# Patient Record
Sex: Male | Born: 1990 | Race: Black or African American | Hispanic: No | Marital: Single | Smoking: Former smoker
Health system: Southern US, Community
[De-identification: ages and names within clinical notes are randomized; demographics above are authoritative.]

---

## 2020-06-18 ENCOUNTER — Ambulatory Visit
Admission: EM | Admit: 2020-06-18 | Discharge: 2020-06-18 | Disposition: A | Discharge status: Home / Self Care | Payer: Self-pay | Attending: Family Medicine | Admitting: Family Medicine

## 2020-06-18 ENCOUNTER — Other Ambulatory Visit: Payer: Self-pay

## 2020-06-18 ENCOUNTER — Ambulatory Visit (INDEPENDENT_AMBULATORY_CARE_PROVIDER_SITE_OTHER): Payer: Self-pay

## 2020-06-18 DIAGNOSIS — M79671 Pain in right foot: Secondary | ICD-10-CM

## 2020-06-18 DIAGNOSIS — M722 Plantar fascial fibromatosis: Secondary | ICD-10-CM

## 2020-06-18 MED ORDER — PREDNISONE 10 MG (21) PO TBPK: ORAL_TABLET | Freq: Every day | ORAL | 0 refills | Status: AC

## 2020-06-18 MED ORDER — CYCLOBENZAPRINE HCL 10 MG PO TABS: 10.0000 mg | ORAL_TABLET | Freq: Two times a day (BID) | ORAL | 0 refills | Status: DC | PRN

## 2020-06-18 NOTE — ED Triage Notes (Signed)
See provider note. 

## 2020-06-18 NOTE — ED Provider Notes (Signed)
[facility]   [id number] 06/18/20 Arrival Time: 1412  EP:PIRJJ PAIN  SUBJECTIVE: History from: patient. Daylyn Azbill is a 30 y.o. male complains of right foot pain intermittently for the last week. Reports that he is on his feet all day in steel toe boots. Has not taken OTC medications for this. Denies a precipitating event or specific injury. Localizes the pain to the sole of the right foot. Describes the pain as intermittent and achy in character with intermittent sharp pain. Symptoms are made worse with activity. Denies similar symptoms in the past. Denies fever, chills, erythema, ecchymosis, effusion, weakness, numbness and tingling, saddle paresthesias, loss of bowel or bladder function.      ROS: As per HPI.  All other pertinent ROS negative.     No past medical history on file.  Not on File No current facility-administered medications on file prior to encounter.   No current outpatient medications on file prior to encounter.   Social History   Socioeconomic History  . Marital status: Single    Spouse name: Not on file  . Number of children: Not on file  . Years of education: Not on file  . Highest education level: Not on file  Occupational History  . Not on file  Tobacco Use  . Smoking status: Not on file  . Smokeless tobacco: Not on file  Substance and Sexual Activity  . Alcohol use: Not on file  . Drug use: Not on file  . Sexual activity: Not on file  Other Topics Concern  . Not on file  Social History Narrative  . Not on file   Social Determinants of Health   Financial Resource Strain: Not on file  Food Insecurity: Not on file  Transportation Needs: Not on file  Physical Activity: Not on file  Stress: Not on file  Social Connections: Not on file  Intimate Partner Violence: Not on file   No family history on file.  OBJECTIVE:  Vitals:   06/18/20 1446  BP: 105/68  Pulse: 63  Resp: 20  Temp: 98.4 F (36.9 C)  TempSrc: Oral  SpO2: 98%     General appearance: ALERT; in no acute distress.  Head: NCAT Lungs: Normal respiratory effort CV: pulses 2+ bilaterally. Cap refill < 2 seconds Musculoskeletal:  Inspection: Skin warm, dry, clear and intact No erythema, effusion noted Palpation: R plantar fascia tender to palpation ROM: Limited ROM active and passive to right foot Skin: warm and dry Neurologic: Ambulates without difficulty; Sensation intact about the upper/ lower extremities Psychological: alert and cooperative; normal mood and affect  DIAGNOSTIC STUDIES:  DG Foot Complete Right  Result Date: 06/18/2020 CLINICAL DATA:  Right foot pain. EXAM: RIGHT FOOT COMPLETE - 3+ VIEW COMPARISON:  None. FINDINGS: There is no evidence of fracture or dislocation. There is no evidence of arthropathy or other focal bone abnormality. Soft tissues are unremarkable. IMPRESSION: Negative. Electronically Signed   By: Obie Dredge M.D.   On: 06/18/2020 15:42     ASSESSMENT & PLAN:  1. Plantar fasciitis of right foot      Meds ordered this encounter  Medications  . predniSONE (STERAPRED UNI-PAK 21 TAB) 10 MG (21) TBPK tablet    Sig: Take by mouth daily for 6 days. Take 6 tablets on day 1, 5 tablets on day 2, 4 tablets on day 3, 3 tablets on day 4, 2 tablets on day 5, 1 tablet on day 6    Dispense:  21 tablet  Refill:  0    Order Specific Question:   Supervising Provider    Answer:   Merrilee Jansky [id number]  . cyclobenzaprine (FLEXERIL) 10 MG tablet    Sig: Take 1 tablet (10 mg total) by mouth 2 (two) times daily as needed for muscle spasms.    Dispense:  20 tablet    Refill:  0    Order Specific Question:   Supervising Provider    Answer:   Merrilee Jansky [[id number]]    Xray of R foot negative today Prescribed steroid taper Prescribed flexeril prn Continue conservative management of rest, ice, and gentle stretches Take ibuprofen as needed for pain relief (may cause abdominal discomfort, ulcers, and GI bleeds avoid  taking with other NSAIDs) Take cyclobenzaprine at nighttime for symptomatic relief. Avoid driving or operating heavy machinery while using medication. Follow up with PCP if symptoms persist Return or go to the ER if you have any new or worsening symptoms (fever, chills, chest pain, abdominal pain, changes in bowel or bladder habits, pain radiating into lower legs)   Reviewed expectations re: course of current medical issues. Questions answered. Outlined signs and symptoms indicating need for more acute intervention. Patient verbalized understanding. After Visit Summary given.       Moshe Cipro, NP 06/18/20 1554

## 2020-06-18 NOTE — Discharge Instructions (Signed)
Your xray today was negative for any fractures or misalignments.   I have sent in a prednisone taper for you to take for 6 days. 6 tablets on day one, 5 tablets on day two, 4 tablets on day three, 3 tablets on day four, 2 tablets on day five, and 1 tablet on day six.  I have sent in flexeril for you to take twice a day as needed for muscle spasms. This medication can make you sleepy. Do not drive or operate heavy machinery with this medication.  May take ibuprofen and tylenol  as needed for pain.  Follow up with this office or with primary care if symptoms are persisting.  Follow up in the ER for high fever, trouble swallowing, trouble breathing, other concerning symptoms.

## 2020-10-04 ENCOUNTER — Ambulatory Visit
Admission: EM | Admit: 2020-10-04 | Discharge: 2020-10-04 | Disposition: A | Discharge status: Home / Self Care | Payer: Self-pay | Attending: Emergency Medicine | Admitting: Emergency Medicine

## 2020-10-04 ENCOUNTER — Other Ambulatory Visit: Payer: Self-pay

## 2020-10-04 ENCOUNTER — Encounter: Payer: Self-pay | Admitting: Emergency Medicine

## 2020-10-04 DIAGNOSIS — L02412 Cutaneous abscess of left axilla: Secondary | ICD-10-CM

## 2020-10-04 MED ORDER — IBUPROFEN 800 MG PO TABS: 800.0000 mg | ORAL_TABLET | Freq: Three times a day (TID) | ORAL | 0 refills | Status: DC

## 2020-10-04 MED ORDER — DOXYCYCLINE HYCLATE 100 MG PO CAPS: 100.0000 mg | ORAL_CAPSULE | Freq: Two times a day (BID) | ORAL | 0 refills | Status: AC

## 2020-10-04 NOTE — ED Provider Notes (Signed)
[name]    CSN: [id number] Arrival date & time: 10/04/20  0806      History   Chief Complaint Chief Complaint  Patient presents with   Ingrown hair    HPI Dave Jones is a 30 y.o. male presenting today for evaluation of ingrown hair.  Reports symptoms began 6 days ago.  Denies history of similar.  Denies fevers.  Pain with moving left arm.  HPI  History reviewed. No pertinent past medical history.  There are no problems to display for this patient.   History reviewed. No pertinent surgical history.     Home Medications    Prior to Admission medications   Medication Sig Start Date End Date Taking? Authorizing Provider  doxycycline (VIBRAMYCIN) 100 MG capsule Take 1 capsule (100 mg total) by mouth 2 (two) times daily for 7 days. 10/04/20 10/11/20 Yes Aaleigha Bozza C, PA-C  ibuprofen (ADVIL) 800 MG tablet Take 1 tablet (800 mg total) by mouth 3 (three) times daily. 10/04/20  Yes Blandon Offerdahl C, PA-C  cyclobenzaprine (FLEXERIL) 10 MG tablet Take 1 tablet (10 mg total) by mouth 2 (two) times daily as needed for muscle spasms. 06/18/20   Moshe Cipro, NP    Family History History reviewed. No pertinent family history.  Social History Social History   Tobacco Use   Smoking status: Former    Years: 15.00    Pack years: 0.00    Types: Cigarettes    Passive exposure: Never   Smokeless tobacco: Never  Substance Use Topics   Alcohol use: Yes    Comment: ocassionally   Drug use: Not Currently    Types: Marijuana     Allergies   Patient has no known allergies.   Review of Systems Review of Systems  Constitutional:  Negative for fatigue and fever.  Eyes:  Negative for redness, itching and visual disturbance.  Respiratory:  Negative for shortness of breath.   Cardiovascular:  Negative for chest pain and leg swelling.  Gastrointestinal:  Negative for nausea and vomiting.  Musculoskeletal:  Negative for arthralgias and myalgias.  Skin:   Positive for color change and wound. Negative for rash.  Neurological:  Negative for dizziness, syncope, weakness, light-headedness and headaches.    Physical Exam Triage Vital Signs ED Triage Vitals  Enc Vitals Group     BP --      Pulse --      Resp --      Temp --      Temp src --      SpO2 --      Weight 10/04/20 0828 155 lb (70.3 kg)     Height 10/04/20 0828 5\' 11"  (1.803 m)     Head Circumference --      Peak Flow --      Pain Score 10/04/20 0826 9     Pain Loc --      Pain Edu? --      Excl. in GC? --    No data found.  Updated Vital Signs BP 130/74 (BP Location: Right Arm)   Pulse 71   Temp 98.5 F (36.9 C) (Oral)   Resp 13   Ht 5\' 11"  (1.803 m)   Wt 155 lb (70.3 kg)   SpO2 98%   BMI 21.62 kg/m   Visual Acuity Right Eye Distance:   Left Eye Distance:   Bilateral Distance:    Right Eye Near:   Left Eye Near:    Bilateral Near:  Physical Exam Vitals and nursing note reviewed.  Constitutional:      Appearance: He is well-developed.     Comments: No acute distress  HENT:     Head: Normocephalic and atraumatic.     Nose: Nose normal.  Eyes:     Conjunctiva/sclera: Conjunctivae normal.  Cardiovascular:     Rate and Rhythm: Normal rate.  Pulmonary:     Effort: Pulmonary effort is normal. No respiratory distress.  Abdominal:     General: There is no distension.  Musculoskeletal:        General: Normal range of motion.     Cervical back: Neck supple.  Skin:    General: Skin is warm and dry.     Comments: Left axilla with approximately 4 cm abscess with fluctuance, central scabbing, tender to palpation  Neurological:     Mental Status: He is alert and oriented to person, place, and time.     UC Treatments / Results  Labs (all labs ordered are listed, but only abnormal results are displayed) Labs Reviewed - No data to display  EKG   Radiology No results found.  Procedures Incision and Drainage  Date/Time: 10/04/2020 11:20  AM Performed by: Irvan Tiedt, [place] C, PA-C Authorized by: Jadzia Ibsen, Junius Creamer, PA-C   Consent:    Consent obtained:  Verbal   Consent given by:  Patient   Risks, benefits, and alternatives were discussed: yes     Risks discussed:  Bleeding, pain and incomplete drainage   Alternatives discussed:  No treatment Universal protocol:    Patient identity confirmed:  Verbally with patient Location:    Type:  Abscess   Location:  Upper extremity   Upper extremity location: Left axilla. Pre-procedure details:    Skin preparation:  Povidone-iodine Sedation:    Sedation type:  None Anesthesia:    Anesthesia method:  Local infiltration   Local anesthetic:  Lidocaine 1% w/o epi Procedure type:    Complexity:  Simple Procedure details:    Ultrasound guidance: no     Needle aspiration: no     Incision types:  Single straight   Incision depth:  Subcutaneous   Wound management:  Probed and deloculated   Drainage:  Purulent   Drainage amount:  Moderate   Wound treatment:  Wound left open   Packing materials:  1/4 in iodoform gauze Post-procedure details:    Procedure completion:  Tolerated well, no immediate complications (including critical care time)  Medications Ordered in UC Medications - No data to display  Initial Impression / Assessment and Plan / UC Course  I have reviewed the triage vital signs and the nursing notes.  Pertinent labs & imaging results that were available during my care of the patient were reviewed by me and considered in my medical decision making (see chart for details).     Abscess versus infected sebaceous cyst to left axilla, I&D performed, placing on doxycycline, warm compresses, packing placed, patient to follow-up and packing removal in 48 hours.  Discussed strict return precautions. Patient verbalized understanding and is agreeable with plan.  Final Clinical Impressions(s) / UC Diagnoses   Final diagnoses:  Abscess of axilla, left     Discharge  Instructions      Please begin doxycycline for 7 days  Return for packing removal in 48 hours   Apply warm compresses/hot rags to area with massage to express further drainage especially the first 24-48 hours  Return if symptoms returning or not improving     ED  Prescriptions     Medication Sig Dispense Auth. Provider   doxycycline (VIBRAMYCIN) 100 MG capsule Take 1 capsule (100 mg total) by mouth 2 (two) times daily for 7 days. 14 capsule Zahniya Zellars C, PA-C   ibuprofen (ADVIL) 800 MG tablet Take 1 tablet (800 mg total) by mouth 3 (three) times daily. 21 tablet Gagandeep Kossman, [place] C, PA-C      PDMP not reviewed this encounter.   Lew Dawes, [place] 10/04/20 1122

## 2020-10-04 NOTE — Discharge Instructions (Addendum)
Please begin doxycycline for 7 days  Return for packing removal in 48 hours   Apply warm compresses/hot rags to area with massage to express further drainage especially the first 24-48 hours  Return if symptoms returning or not improving

## 2020-10-04 NOTE — ED Triage Notes (Addendum)
Patient c/o ingrown hair x 6 days.   Patient endorses the ingrown hair is in LFT armpit.   Patient endorses symptoms have progressively become worst.   Patient endorses worsening pain at night.   Patient endorses swelling and "pink drainage" from site.    Patient has used a warm compress and ibuprofen with no relief of symptoms.

## 2020-10-06 ENCOUNTER — Encounter: Payer: Self-pay | Admitting: Emergency Medicine

## 2020-10-06 ENCOUNTER — Ambulatory Visit
Admission: EM | Admit: 2020-10-06 | Discharge: 2020-10-06 | Disposition: A | Discharge status: Home / Self Care | Payer: Self-pay

## 2020-10-06 ENCOUNTER — Other Ambulatory Visit: Payer: Self-pay

## 2020-10-06 DIAGNOSIS — Z4800 Encounter for change or removal of nonsurgical wound dressing: Secondary | ICD-10-CM

## 2020-10-06 NOTE — ED Provider Notes (Signed)
[name]    CSN: [id number] Arrival date & time: 10/06/20  0830      History   Chief Complaint Chief Complaint  Patient presents with   Wound Check    HPI Dave Jones is a 30 y.o. male presenting today for packing removal.  Was seen here 2 days ago for left axilla abscess.  I&D performed with packing placed.  Has been taking doxycycline without problem.  Pain has been improving.  HPI  History reviewed. No pertinent past medical history.  There are no problems to display for this patient.   History reviewed. No pertinent surgical history.     Home Medications    Prior to Admission medications   Medication Sig Start Date End Date Taking? Authorizing Provider  cyclobenzaprine (FLEXERIL) 10 MG tablet Take 1 tablet (10 mg total) by mouth 2 (two) times daily as needed for muscle spasms. 06/18/20   Moshe Cipro, NP  doxycycline (VIBRAMYCIN) 100 MG capsule Take 1 capsule (100 mg total) by mouth 2 (two) times daily for 7 days. 10/04/20 10/11/20  Marwan Lipe C, PA-C  ibuprofen (ADVIL) 800 MG tablet Take 1 tablet (800 mg total) by mouth 3 (three) times daily. 10/04/20   Clancey Welton, Junius Creamer, PA-C    Family History History reviewed. No pertinent family history.  Social History Social History   Tobacco Use   Smoking status: Former    Years: 15.00    Pack years: 0.00    Types: Cigarettes    Passive exposure: Never   Smokeless tobacco: Never  Substance Use Topics   Alcohol use: Yes    Comment: ocassionally   Drug use: Not Currently    Types: Marijuana     Allergies   Patient has no known allergies.   Review of Systems Review of Systems  Constitutional:  Negative for fatigue and fever.  Eyes:  Negative for redness, itching and visual disturbance.  Respiratory:  Negative for shortness of breath.   Cardiovascular:  Negative for chest pain and leg swelling.  Gastrointestinal:  Negative for nausea and vomiting.  Musculoskeletal:  Negative for  arthralgias and myalgias.  Skin:  Positive for wound. Negative for color change and rash.  Neurological:  Negative for dizziness, syncope, weakness, light-headedness and headaches.    Physical Exam Triage Vital Signs ED Triage Vitals  Enc Vitals Group     BP      Pulse      Resp      Temp      Temp src      SpO2      Weight      Height      Head Circumference      Peak Flow      Pain Score      Pain Loc      Pain Edu?      Excl. in GC?    No data found.  Updated Vital Signs BP 132/72 (BP Location: Left Arm)   Pulse 61   Temp 97.9 F (36.6 C) (Oral)   Resp 18   SpO2 98%   Visual Acuity Right Eye Distance:   Left Eye Distance:   Bilateral Distance:    Right Eye Near:   Left Eye Near:    Bilateral Near:     Physical Exam Vitals and nursing note reviewed.  Constitutional:      Appearance: He is well-developed.     Comments: No acute distress  HENT:     Head: Normocephalic  and atraumatic.     Nose: Nose normal.  Eyes:     Conjunctiva/sclera: Conjunctivae normal.  Cardiovascular:     Rate and Rhythm: Normal rate.  Pulmonary:     Effort: Pulmonary effort is normal. No respiratory distress.  Abdominal:     General: There is no distension.  Musculoskeletal:        General: Normal range of motion.     Cervical back: Neck supple.  Skin:    General: Skin is warm and dry.  Neurological:     Mental Status: He is alert and oriented to person, place, and time.     UC Treatments / Results  Labs (all labs ordered are listed, but only abnormal results are displayed) Labs Reviewed - No data to display  EKG   Radiology No results found.  Procedures Procedures (including critical care time)  Medications Ordered in UC Medications - No data to display  Initial Impression / Assessment and Plan / UC Course  I have reviewed the triage vital signs and the nursing notes.  Pertinent labs & imaging results that were available during my care of the patient  were reviewed by me and considered in my medical decision making (see chart for details).     Packing removed, area appears to be less swollen inflamed and erythematous compared to 2 days ago.  Continue Doxy and warm compresses.  Discussed strict return precautions. Patient verbalized understanding and is agreeable with plan.  Final Clinical Impressions(s) / UC Diagnoses   Final diagnoses:  Abscess packing removal   Discharge Instructions   None    ED Prescriptions   None    PDMP not reviewed this encounter.   Lew Dawes, [place] 10/06/20 [phone]

## 2020-10-06 NOTE — ED Triage Notes (Signed)
Pt here for packing removal from I&D from Friday under left axillary area

## 2020-11-18 ENCOUNTER — Ambulatory Visit
Admission: EM | Admit: 2020-11-18 | Discharge: 2020-11-18 | Disposition: A | Discharge status: Home / Self Care | Payer: Self-pay | Attending: Emergency Medicine | Admitting: Emergency Medicine

## 2020-11-18 ENCOUNTER — Encounter: Payer: Self-pay | Admitting: Emergency Medicine

## 2020-11-18 ENCOUNTER — Other Ambulatory Visit: Payer: Self-pay

## 2020-11-18 DIAGNOSIS — L02412 Cutaneous abscess of left axilla: Secondary | ICD-10-CM

## 2020-11-18 MED ORDER — IBUPROFEN 800 MG PO TABS: 800.0000 mg | ORAL_TABLET | Freq: Three times a day (TID) | ORAL | 0 refills | Status: DC

## 2020-11-18 MED ORDER — CEPHALEXIN 500 MG PO CAPS: 1000.0000 mg | ORAL_CAPSULE | Freq: Two times a day (BID) | ORAL | 0 refills | Status: DC

## 2020-11-18 NOTE — ED Provider Notes (Signed)
[name]    CSN: [id number] Arrival date & time: 11/18/20  5009      History   Chief Complaint Chief Complaint  Patient presents with   Abscess    HPI Dave Jones is a 30 y.o. male.   Patient presents with abscess under left axilla for 5 days. Has started to increase in size and become tender. Has not attempted treatment. Denies fever, chills, drainage. Has had abscess before in different location.   History reviewed. No pertinent past medical history.  There are no problems to display for this patient.   History reviewed. No pertinent surgical history.     Home Medications    Prior to Admission medications   Medication Sig Start Date End Date Taking? Authorizing Provider  cephALEXin (KEFLEX) 500 MG capsule Take 2 capsules (1,000 mg total) by mouth 2 (two) times daily. 11/18/20  Yes Jazmyne Beauchesne, Elita Boone, NP  cyclobenzaprine (FLEXERIL) 10 MG tablet Take 1 tablet (10 mg total) by mouth 2 (two) times daily as needed for muscle spasms. 06/18/20   Moshe Cipro, NP  ibuprofen (ADVIL) 800 MG tablet Take 1 tablet (800 mg total) by mouth 3 (three) times daily. 11/18/20   Valinda Hoar, NP    Family History Family History  Family history unknown: Yes    Social History Social History   Tobacco Use   Smoking status: Former    Years: 15.00    Types: Cigarettes    Passive exposure: Never   Smokeless tobacco: Never  Substance Use Topics   Alcohol use: Yes    Comment: ocassionally   Drug use: Not Currently    Types: Marijuana     Allergies   Patient has no known allergies.   Review of Systems Review of Systems  Constitutional: Negative.   Cardiovascular: Negative.   Skin:  Positive for wound. Negative for color change, pallor and rash.  Neurological: Negative.     Physical Exam Triage Vital Signs ED Triage Vitals [11/18/20 0850]  Enc Vitals Group     BP 108/61     Pulse Rate 76     Resp 18     Temp 98 F (36.7 C)     Temp Source  Oral     SpO2 98 %     Weight      Height      Head Circumference      Peak Flow      Pain Score 5     Pain Loc      Pain Edu?      Excl. in GC?    No data found.  Updated Vital Signs BP 108/61 (BP Location: Left Arm)   Pulse 76   Temp 98 F (36.7 C) (Oral)   Resp 18   SpO2 98%   Visual Acuity Right Eye Distance:   Left Eye Distance:   Bilateral Distance:    Right Eye Near:   Left Eye Near:    Bilateral Near:     Physical Exam Constitutional:      Appearance: Normal appearance. He is normal weight.  HENT:     Head: Normocephalic.  Eyes:     Extraocular Movements: Extraocular movements intact.  Pulmonary:     Effort: Pulmonary effort is normal.  Skin:    Comments: Immature abscess 1x1 present under left axilla at 6 0 clock   Neurological:     Mental Status: He is alert and oriented to person, place, and time. Mental status is  at baseline.  Psychiatric:        Mood and Affect: Mood normal.        Behavior: Behavior normal.     UC Treatments / Results  Labs (all labs ordered are listed, but only abnormal results are displayed) Labs Reviewed - No data to display  EKG   Radiology No results found.  Procedures Procedures (including critical care time)  Medications Ordered in UC Medications - No data to display  Initial Impression / Assessment and Plan / UC Course  I have reviewed the triage vital signs and the nursing notes.  Pertinent labs & imaging results that were available during my care of the patient were reviewed by me and considered in my medical decision making (see chart for details).  Abscess of left axilla  1, Keflex 1000 mg bid for 5 days 2. Ibuprofen 800 mg tid prn 3. Warm compresses to affected area at least 4 times a day 4. Exfoliation to area every other day for prevention 5. Return precautions given for non healing non draining site   Final Clinical Impressions(s) / UC Diagnoses   Final diagnoses:  Abscess of left axilla      Discharge Instructions      Take antibiotic twice a day for 5 days  Can use ibuprofen three times a day with food if pain increases   Hold warm compresses to affected area at least four times a day, the more the better this helps area to drain  May follow up for non healing area, increased pain, increased swelling, fever, chills   May follow up with general surgery for evaluation of if reoccurring area for removal of sac     ED Prescriptions     Medication Sig Dispense Auth. Provider   cephALEXin (KEFLEX) 500 MG capsule Take 2 capsules (1,000 mg total) by mouth 2 (two) times daily. 20 capsule Anna-Marie Coller R, NP   ibuprofen (ADVIL) 800 MG tablet Take 1 tablet (800 mg total) by mouth 3 (three) times daily. 21 tablet Arlyss Weathersby, Elita Boone, NP      PDMP not reviewed this encounter.   Valinda Hoar, [place] 11/18/20 [phone]

## 2020-11-18 NOTE — Discharge Instructions (Addendum)
Take antibiotic twice a day for 5 days  Can use ibuprofen three times a day with food if pain increases   Hold warm compresses to affected area at least four times a day, the more the better this helps area to drain  May follow up for non healing area, increased pain, increased swelling, fever, chills   May follow up with general surgery for evaluation of if reoccurring area for removal of sac

## 2020-11-18 NOTE — ED Triage Notes (Signed)
Pt here for possible abscess under left arm; pt denies draining and sts hx of similar

## 2021-02-05 ENCOUNTER — Encounter: Payer: Self-pay | Admitting: Emergency Medicine

## 2021-02-05 ENCOUNTER — Ambulatory Visit
Admission: EM | Admit: 2021-02-05 | Discharge: 2021-02-05 | Disposition: A | Discharge status: Home / Self Care | Payer: Self-pay | Attending: Internal Medicine | Admitting: Internal Medicine

## 2021-02-05 ENCOUNTER — Other Ambulatory Visit: Payer: Self-pay

## 2021-02-05 DIAGNOSIS — J069 Acute upper respiratory infection, unspecified: Secondary | ICD-10-CM | POA: Insufficient documentation

## 2021-02-05 DIAGNOSIS — Z20822 Contact with and (suspected) exposure to covid-19: Secondary | ICD-10-CM | POA: Insufficient documentation

## 2021-02-05 DIAGNOSIS — R112 Nausea with vomiting, unspecified: Secondary | ICD-10-CM | POA: Insufficient documentation

## 2021-02-05 LAB — POCT RAPID STREP A (OFFICE): Rapid Strep A Screen: NEGATIVE

## 2021-02-05 MED ORDER — ONDANSETRON HCL 4 MG/2ML IJ SOLN
4.0000 mg | Freq: Once | INTRAMUSCULAR | Status: AC
  Administered 2021-02-05: 16:00:00 4 mg via INTRAMUSCULAR

## 2021-02-05 MED ORDER — ONDANSETRON 4 MG PO TBDP: 4.0000 mg | ORAL_TABLET | Freq: Three times a day (TID) | ORAL | 0 refills | Status: DC | PRN

## 2021-02-05 NOTE — ED Provider Notes (Signed)
[name]    CSN: [id number] Arrival date & time: 02/05/21  1411      History   Chief Complaint Chief Complaint  Patient presents with   Emesis    HPI Dave Jones is a 30 y.o. male.   Patient presents with headache, nausea, vomiting, cough, runny nose that has been present for approximately 2 days.  Denies any diarrhea or abdominal pain.  Has had multiple episodes of emesis throughout the night.  Patient has not been able to keep any food or fluids down in the past 24 hours.  Patient took 2 at home COVID-19 tests that were both negative.  Denies any blood in emesis.  Having regular bowel movements. Cough is nonproductive per patient.  Daughter has similar symptoms.  Patient is attributing his symptoms to food poisoning as he ate food and symptoms started directly after.    Emesis  History reviewed. No pertinent past medical history.  There are no problems to display for this patient.   History reviewed. No pertinent surgical history.     Home Medications    Prior to Admission medications   Medication Sig Start Date End Date Taking? Authorizing Provider  ondansetron (ZOFRAN-ODT) 4 MG disintegrating tablet Take 1 tablet (4 mg total) by mouth every 8 (eight) hours as needed for nausea or vomiting. 02/05/21  Yes Manda Holstad, Rolly Salter E, FNP  cephALEXin (KEFLEX) 500 MG capsule Take 2 capsules (1,000 mg total) by mouth 2 (two) times daily. 11/18/20   White, Elita Boone, NP  cyclobenzaprine (FLEXERIL) 10 MG tablet Take 1 tablet (10 mg total) by mouth 2 (two) times daily as needed for muscle spasms. 06/18/20   Moshe Cipro, NP  ibuprofen (ADVIL) 800 MG tablet Take 1 tablet (800 mg total) by mouth 3 (three) times daily. 11/18/20   Valinda Hoar, NP    Family History Family History  Family history unknown: Yes    Social History Social History   Tobacco Use   Smoking status: Former    Years: 15.00    Types: Cigarettes    Passive exposure: Never   Smokeless  tobacco: Never  Substance Use Topics   Alcohol use: Yes    Comment: ocassionally   Drug use: Not Currently    Types: Marijuana     Allergies   Patient has no known allergies.   Review of Systems Review of Systems Per HPI  Physical Exam Triage Vital Signs ED Triage Vitals  Enc Vitals Group     BP 02/05/21 1540 131/70     Pulse Rate 02/05/21 1540 78     Resp 02/05/21 1540 18     Temp 02/05/21 1540 99.3 F (37.4 C)     Temp Source 02/05/21 1540 Oral     SpO2 02/05/21 1540 97 %     Weight 02/05/21 1542 160 lb (72.6 kg)     Height 02/05/21 1542 6' (1.829 m)     Head Circumference --      Peak Flow --      Pain Score 02/05/21 1541 8     Pain Loc --      Pain Edu? --      Excl. in GC? --    No data found.  Updated Vital Signs BP 131/70 (BP Location: Left Arm)   Pulse 78   Temp 99.3 F (37.4 C) (Oral)   Resp 18   Ht 6' (1.829 m)   Wt 160 lb (72.6 kg)   SpO2 97%  BMI 21.70 kg/m   Visual Acuity Right Eye Distance:   Left Eye Distance:   Bilateral Distance:    Right Eye Near:   Left Eye Near:    Bilateral Near:     Physical Exam Constitutional:      General: He is not in acute distress.    Appearance: Normal appearance. He is not toxic-appearing or diaphoretic.  HENT:     Head: Normocephalic and atraumatic.     Right Ear: Tympanic membrane and ear canal normal.     Left Ear: Tympanic membrane and ear canal normal.     Nose: Congestion present.     Mouth/Throat:     Mouth: Mucous membranes are moist.     Pharynx: Posterior oropharyngeal erythema present.  Eyes:     Extraocular Movements: Extraocular movements intact.     Conjunctiva/sclera: Conjunctivae normal.     Pupils: Pupils are equal, round, and reactive to light.  Cardiovascular:     Rate and Rhythm: Normal rate and regular rhythm.     Pulses: Normal pulses.     Heart sounds: Normal heart sounds.  Pulmonary:     Effort: Pulmonary effort is normal. No respiratory distress.     Breath  sounds: Normal breath sounds. No stridor. No wheezing or rhonchi.  Abdominal:     General: Abdomen is flat. Bowel sounds are normal. There is no distension.     Palpations: Abdomen is soft.     Tenderness: There is no abdominal tenderness.  Musculoskeletal:        General: Normal range of motion.     Cervical back: Normal range of motion.  Skin:    General: Skin is warm and dry.  Neurological:     General: No focal deficit present.     Mental Status: He is alert and oriented to person, place, and time. Mental status is at baseline.  Psychiatric:        Mood and Affect: Mood normal.        Behavior: Behavior normal.     UC Treatments / Results  Labs (all labs ordered are listed, but only abnormal results are displayed) Labs Reviewed  CULTURE, GROUP A STREP [facility])  POCT RAPID STREP A (OFFICE)    EKG   Radiology No results found.  Procedures Procedures (including critical care time)  Medications Ordered in UC Medications  ondansetron (ZOFRAN) injection 4 mg (4 mg Intramuscular Given 02/05/21 1611)    Initial Impression / Assessment and Plan / UC Course  I have reviewed the triage vital signs and the nursing notes.  Pertinent labs & imaging results that were available during my care of the patient were reviewed by me and considered in my medical decision making (see chart for details).     Suspect the patient's symptoms are viral in etiology given close exposure to another acute illness and physical exam.  Ondansetron 4 mg IM given in urgent care today.  Ondansetron also prescribed to take as needed at home.  Patient to increase clear oral fluid intake to prevent dehydration.  Bland diet.  Rapid strep test was negative.  Throat culture is pending.  Do not think COVID-19 testing is necessary given two negative COVID-19 tests at home.  No red flags seen on exam.  No signs of dehydration.Discussed strict return precautions. Patient verbalized understanding and is agreeable  with plan.  Final Clinical Impressions(s) / UC Diagnoses   Final diagnoses:  Nausea and vomiting, unspecified vomiting type  Viral upper respiratory  tract infection with cough  Encounter for laboratory testing for COVID-19 virus     Discharge Instructions      You were given a nausea medication injection in urgent care today.  You have also been sent nausea medication to take as needed in pill form.  Please pick this up from the pharmacy.  Please do not take pill form for at least 8 hours following injection.  Increase clear oral fluid intake.  Rapid strep test was negative.  Throat culture is pending.  We will call if it is positive.     ED Prescriptions     Medication Sig Dispense Auth. Provider   ondansetron (ZOFRAN-ODT) 4 MG disintegrating tablet Take 1 tablet (4 mg total) by mouth every 8 (eight) hours as needed for nausea or vomiting. 20 tablet [place], Acie Fredrickson, [place]      PDMP not reviewed this encounter.   Gustavus Bryant, [place] 02/05/21 [phone]

## 2021-02-05 NOTE — ED Triage Notes (Signed)
Patient c/o possible food poisoning, headache, vomiting x 2 days, home COVID test x 2 was negative.  No appetite.  Patient is vaccinated for COVID.

## 2021-02-05 NOTE — Discharge Instructions (Signed)
You were given a nausea medication injection in urgent care today.  You have also been sent nausea medication to take as needed in pill form.  Please pick this up from the pharmacy.  Please do not take pill form for at least 8 hours following injection.  Increase clear oral fluid intake.  Rapid strep test was negative.  Throat culture is pending.  We will call if it is positive.

## 2021-02-07 LAB — CULTURE, GROUP A STREP (THRC)

## 2021-12-10 ENCOUNTER — Ambulatory Visit
Admission: EM | Admit: 2021-12-10 | Discharge: 2021-12-10 | Disposition: A | Discharge status: Home / Self Care | Payer: BC Managed Care – PPO | Attending: Emergency Medicine | Admitting: Emergency Medicine

## 2021-12-10 DIAGNOSIS — U071 COVID-19: Secondary | ICD-10-CM | POA: Diagnosis present

## 2021-12-10 LAB — RESP PANEL BY RT-PCR (FLU A&B, COVID) ARPGX2
Influenza A by PCR: NEGATIVE
Influenza B by PCR: NEGATIVE
SARS Coronavirus 2 by RT PCR: POSITIVE — AB

## 2021-12-10 LAB — GROUP A STREP BY PCR: Group A Strep by PCR: NOT DETECTED

## 2021-12-10 MED ORDER — ALUM & MAG HYDROXIDE-SIMETH 200-200-20 MG/5ML PO SUSP
30.0000 mL | Freq: Once | ORAL | Status: AC
  Administered 2021-12-10: 30 mL via ORAL

## 2021-12-10 MED ORDER — IBUPROFEN 600 MG PO TABS
600.0000 mg | ORAL_TABLET | Freq: Once | ORAL | Status: AC
  Administered 2021-12-10: 600 mg via ORAL

## 2021-12-10 MED ORDER — IBUPROFEN 800 MG PO TABS: 800.0000 mg | ORAL_TABLET | Freq: Once | ORAL | Status: DC

## 2021-12-10 MED ORDER — ACETAMINOPHEN 500 MG PO TABS
1000.0000 mg | ORAL_TABLET | Freq: Once | ORAL | Status: AC
  Administered 2021-12-10: 1000 mg via ORAL

## 2021-12-10 MED ORDER — IBUPROFEN 600 MG PO TABS: 600.0000 mg | ORAL_TABLET | Freq: Four times a day (QID) | ORAL | 0 refills | Status: DC | PRN

## 2021-12-10 MED ORDER — LIDOCAINE VISCOUS HCL 2 % MT SOLN
15.0000 mL | Freq: Once | OROMUCOSAL | Status: AC
  Administered 2021-12-10: 15 mL via OROMUCOSAL

## 2021-12-10 MED ORDER — FLUTICASONE PROPIONATE 50 MCG/ACT NA SUSP: 2.0000 | Freq: Every day | NASAL | 0 refills | Status: DC

## 2021-12-10 MED ORDER — MOLNUPIRAVIR EUA 200MG CAPSULE: 4.0000 | ORAL_CAPSULE | Freq: Two times a day (BID) | ORAL | 0 refills | Status: AC

## 2021-12-10 NOTE — ED Provider Notes (Signed)
HPI  SUBJECTIVE:  Dave Jones is a 31 y.o. male who presents with 2 days of body aches, severe sore throat, cough productive of mucus, nasal congestion, clear rhinorrhea, headaches, fevers Tmax 101.  No sinus pain or pressure, facial swelling, facial pain, loss of sense of smell or taste, wheezing, shortness of breath, nausea, vomiting, diarrhea, abdominal pain, rash.  No drooling, trismus, neck stiffness, muffled voice, sensation of throat swelling shut, difficulty breathing.  No known COVID or flu exposure.  He got 2 doses of the COVID-vaccine.  No antibiotics in the past month.  No antipyretic in the past 6 hours.  He tried multisymptom Tylenol cold and flu severe for adults without improvement in his symptoms.  Symptoms are worse with swallowing, vaping and with strong smells.  He has never had COVID before.  He has a stress fracture in his heel.  He has no other medical problems.  PCP: None.   History reviewed. No pertinent past medical history.  History reviewed. No pertinent surgical history.  Family History  Family history unknown: Yes    Social History   Tobacco Use   Smoking status: Former    Years: 15.00    Types: Cigarettes    Passive exposure: Never   Smokeless tobacco: Never  Vaping Use   Vaping Use: Some days  Substance Use Topics   Alcohol use: Yes    Comment: ocassionally   Drug use: Not Currently    Types: Marijuana    No current facility-administered medications for this encounter.  Current Outpatient Medications:    fluticasone (FLONASE) 50 MCG/ACT nasal spray, Place 2 sprays into both nostrils daily., Disp: 16 g, Rfl: 0   ibuprofen (ADVIL) 600 MG tablet, Take 1 tablet (600 mg total) by mouth every 6 (six) hours as needed., Disp: 30 tablet, Rfl: 0   molnupiravir EUA (LAGEVRIO) 200 mg CAPS capsule, Take 4 capsules (800 mg total) by mouth 2 (two) times daily for 5 days., Disp: 40 capsule, Rfl: 0  No Known Allergies   ROS  As noted in HPI.   Physical  Exam  BP 121/84 (BP Location: Left Arm)   Pulse 64   Temp 98.6 F (37 C) (Oral)   Ht 6' (1.829 m)   Wt 68 kg   SpO2 99%   BMI 20.34 kg/m   Constitutional: Well developed, well nourished, no acute distress Eyes:  EOMI, conjunctiva normal bilaterally HENT: Normocephalic, atraumatic,mucus membranes moist.  Clear nasal congestion.  Erythematous, swollen turbinates.  Positive maxillary, frontal sinus tenderness.  Intensely erythematous oropharynx with enlarged tonsils.  No exudates.  Uvula midline.  No cobblestoning, postnasal drip. Neck: Positive cervical lymphadenopathy Respiratory: Normal inspiratory effort, lungs clear bilaterally Cardiovascular: Normal rate, regular rate, no murmurs GI: nondistended soft, nontender, no splenomegaly skin: No rash, skin intact Musculoskeletal: no deformities Neurologic: Alert & oriented x 3, no focal neuro deficits Psychiatric: Speech and behavior appropriate   ED Course   Medications  alum & mag hydroxide-simeth (MAALOX/MYLANTA) 200-200-20 MG/5ML suspension 30 mL (30 mLs Oral Given 12/10/21 1855)  lidocaine (XYLOCAINE) 2 % viscous mouth solution 15 mL (15 mLs Mouth/Throat Given 12/10/21 1855)  acetaminophen (TYLENOL) tablet 1,000 mg (1,000 mg Oral Given 12/10/21 1854)  ibuprofen (ADVIL) tablet 600 mg (600 mg Oral Given 12/10/21 1855)    Orders Placed This Encounter  Procedures   Group A Strep by PCR    Standing Status:   Standing    Number of Occurrences:   1   Resp Panel by  RT-PCR (Flu A&B, Covid) Anterior Nasal Swab    Standing Status:   Standing    Number of Occurrences:   1   Airborne and Contact precautions    Standing Status:   Standing    Number of Occurrences:   1    Results for orders placed or performed during the hospital encounter of 12/10/21 (from the past 24 hour(s))  Group A Strep by PCR     Status: None   Collection Time: 12/10/21  6:08 PM   Specimen: Throat; Sterile Swab  Result Value Ref Range   Group A Strep by PCR  NOT DETECTED NOT DETECTED  Resp Panel by RT-PCR (Flu A&B, Covid) Throat     Status: Abnormal   Collection Time: 12/10/21  6:08 PM   Specimen: Throat; Nasal Swab  Result Value Ref Range   SARS Coronavirus 2 by RT PCR POSITIVE (A) NEGATIVE   Influenza A by PCR NEGATIVE NEGATIVE   Influenza B by PCR NEGATIVE NEGATIVE   No results found.  ED Clinical Impression  1. COVID-19 virus infection      ED Assessment/Plan  Gave patient viscous lidocaine/Maalox, Tylenol/ibuprofen here.  Strep PCR, influenza negative.  Patient COVID-positive.  Patient does not meet any clear indications for antivirals, but would like to have them.  Discussed Paxlovid versus molnupiravir.  Patient has opted for Molnupiravir.  Will send home with Tylenol/ibuprofen, Flonase, Mucinex D, Benadryl/Maalox.  Work note for 3 days.  Patient declined assistance in finding a PCP today, will provide primary care list for routine care.  Discussed labs,  MDM, treatment plan, and plan for follow-up with patient. Discussed sn/sx that should prompt return to the ED. patient agrees with plan.   Meds ordered this encounter  Medications   alum & mag hydroxide-simeth (MAALOX/MYLANTA) 200-200-20 MG/5ML suspension 30 mL   lidocaine (XYLOCAINE) 2 % viscous mouth solution 15 mL   DISCONTD: ibuprofen (ADVIL) tablet 800 mg   acetaminophen (TYLENOL) tablet 1,000 mg   ibuprofen (ADVIL) tablet 600 mg   molnupiravir EUA (LAGEVRIO) 200 mg CAPS capsule    Sig: Take 4 capsules (800 mg total) by mouth 2 (two) times daily for 5 days.    Dispense:  40 capsule    Refill:  0   fluticasone (FLONASE) 50 MCG/ACT nasal spray    Sig: Place 2 sprays into both nostrils daily.    Dispense:  16 g    Refill:  0   ibuprofen (ADVIL) 600 MG tablet    Sig: Take 1 tablet (600 mg total) by mouth every 6 (six) hours as needed.    Dispense:  30 tablet    Refill:  0      *This clinic note was created using Scientist, clinical (histocompatibility and immunogenetics). Therefore, there may be  occasional mistakes despite careful proofreading.  ?    Domenick Gong, MD 12/10/21 [phone]

## 2021-12-10 NOTE — ED Triage Notes (Signed)
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

## 2021-12-10 NOTE — Discharge Instructions (Addendum)
COVID is positive.  Flu, strep are negative.  1 gram of Tylenol and 600 mg ibuprofen together 3-4 times a day as needed for pain.  Make sure you drink plenty of extra fluids.  Some people find salt water gargles and  Traditional Medicinal's "Throat Coat" tea helpful. Take 5 mL of liquid Benadryl and 5 mL of Maalox. Mix it together, and then hold it in your mouth for as long as you can and then swallow. You may do this 4 times a day.  Flonase, saline nasal irrigation with a NeilMed sinus rinse with distilled water as often as you want, Mucinex D for the nasal congestion.  You will need to quarantine for 3 more days, then wear a mask at all times for 5 days after finishing quarantine.  Here is a list of primary care providers who are taking new patients:  [facility] [place] Dr. Joseph Berkshire (sports medicine) Dr. Elizabeth Sauer [address] [place] [address] [phone]  [facility] at [facility] [address] [place], [place] [address] [phone]  [facility] [place] [address]  [place] [address]  [phone]  [facility] [address] [place], [place] [address] [phone]  [facility] [address] [place]  [phone] [place], [place] [address]  Here are clinics/ other resources who will see you if you do not have insurance. Some have certain criteria that you must meet. Call them and find out what they are:  [facility]: [address], [place], [place] [address] Phone: [phone] Hours: First and Third Saturdays of each Month, 9 a.m. - 1 p.m.  [facility]: [address], [address] [place], [place] [address] Phone: [phone] Hours: Tuesday, 4 p.m. - 8 p.m. Thursday, 1 p.m. - 8 p.m. Wednesday, 9 a.m. - [facility] [address], [place], [place] [address] Phone: [phone] Pharmacy Phone Number: [phone] Dental Phone Number: [phone] [facility] Insurance Help:  [phone]  Dental Hours: Monday - Thursday, 8 a.m. - 6 p.m.  [facility] [address] [place], [place] [address] Phone: [phone] Pharmacy Phone Number: [phone] [facility] Insurance Help: [phone]  [facility] [address] [place] [place], [place] [address] Phone: [phone] Pharmacy Phone Number: [phone] [facility] Insurance Help: [phone]  [facility] [address] [place], [place] [address] Phone: [phone] [facility] Insurance Help: [phone]   [facility] [address] [place], [place] [address] Phone: [phone]  Go to www.goodrx.com  or www.costplusdrugs.com to look up your medications. This will give you a list of where you can find your prescriptions at the most affordable prices. Or ask the pharmacist what the cash price is, or if they have any other discount programs available to help make your medication more affordable. This can be less expensive than what you would pay with insurance.

## 2022-06-26 IMAGING — DX DG FOOT COMPLETE 3+V*R*
3 series · 3 of 3 positions shown · non-contrast
Comparison: None.

CLINICAL DATA: Right foot pain.

EXAM:
RIGHT FOOT COMPLETE - 3+ VIEW

[foot supine dp]
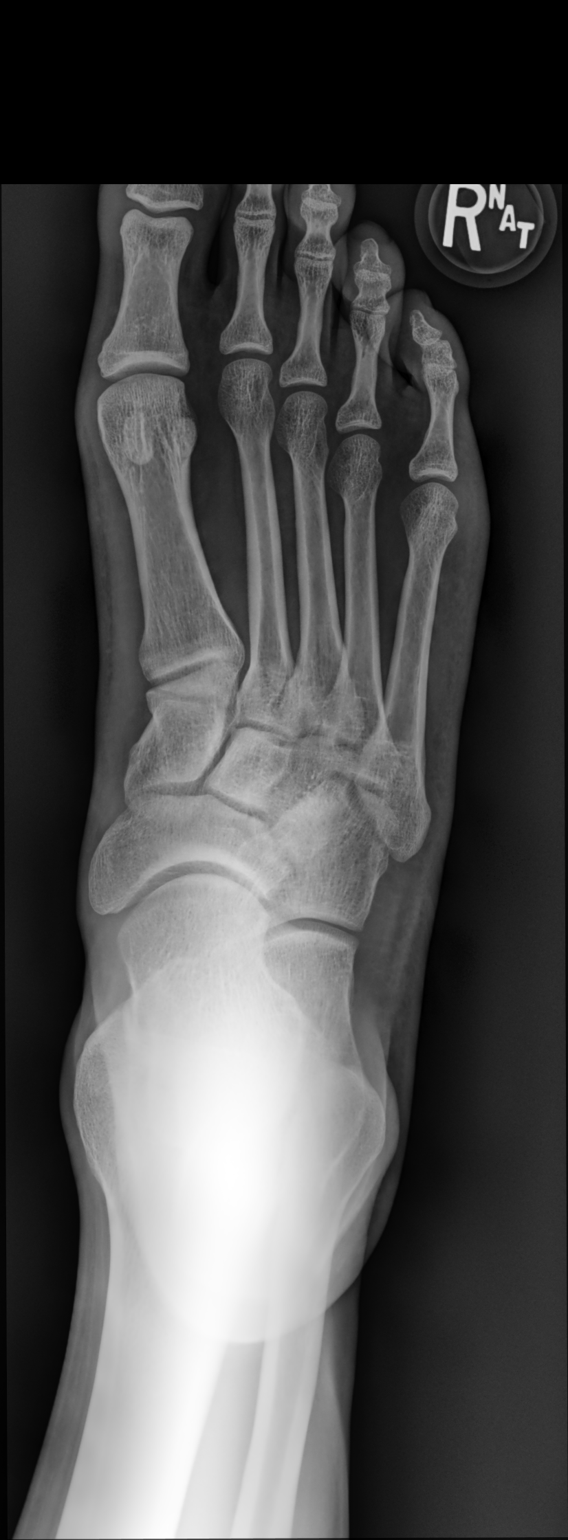

[foot medial oblique]
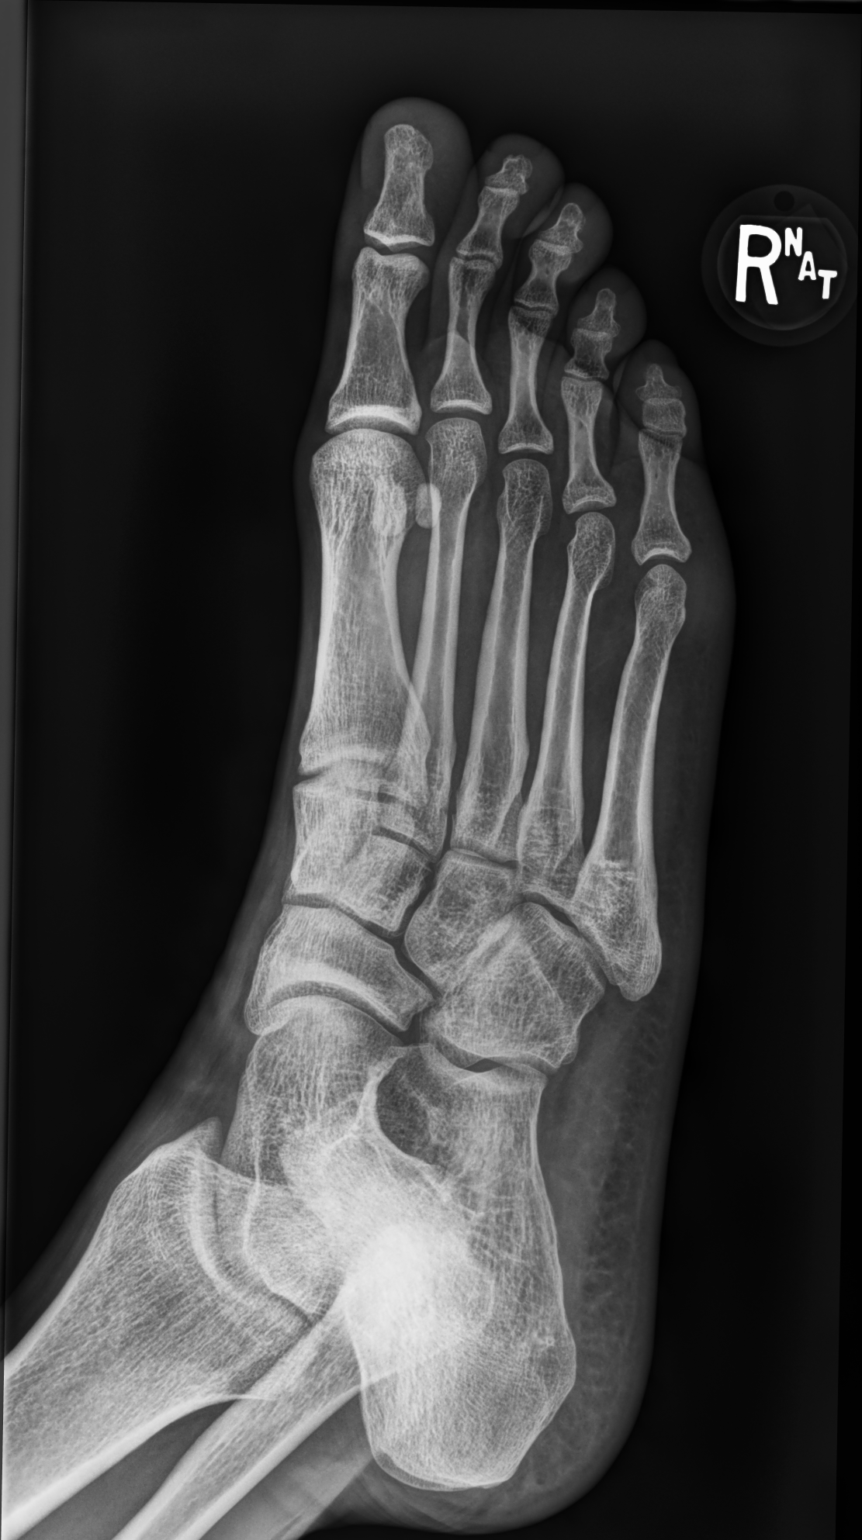

[foot supine lat]
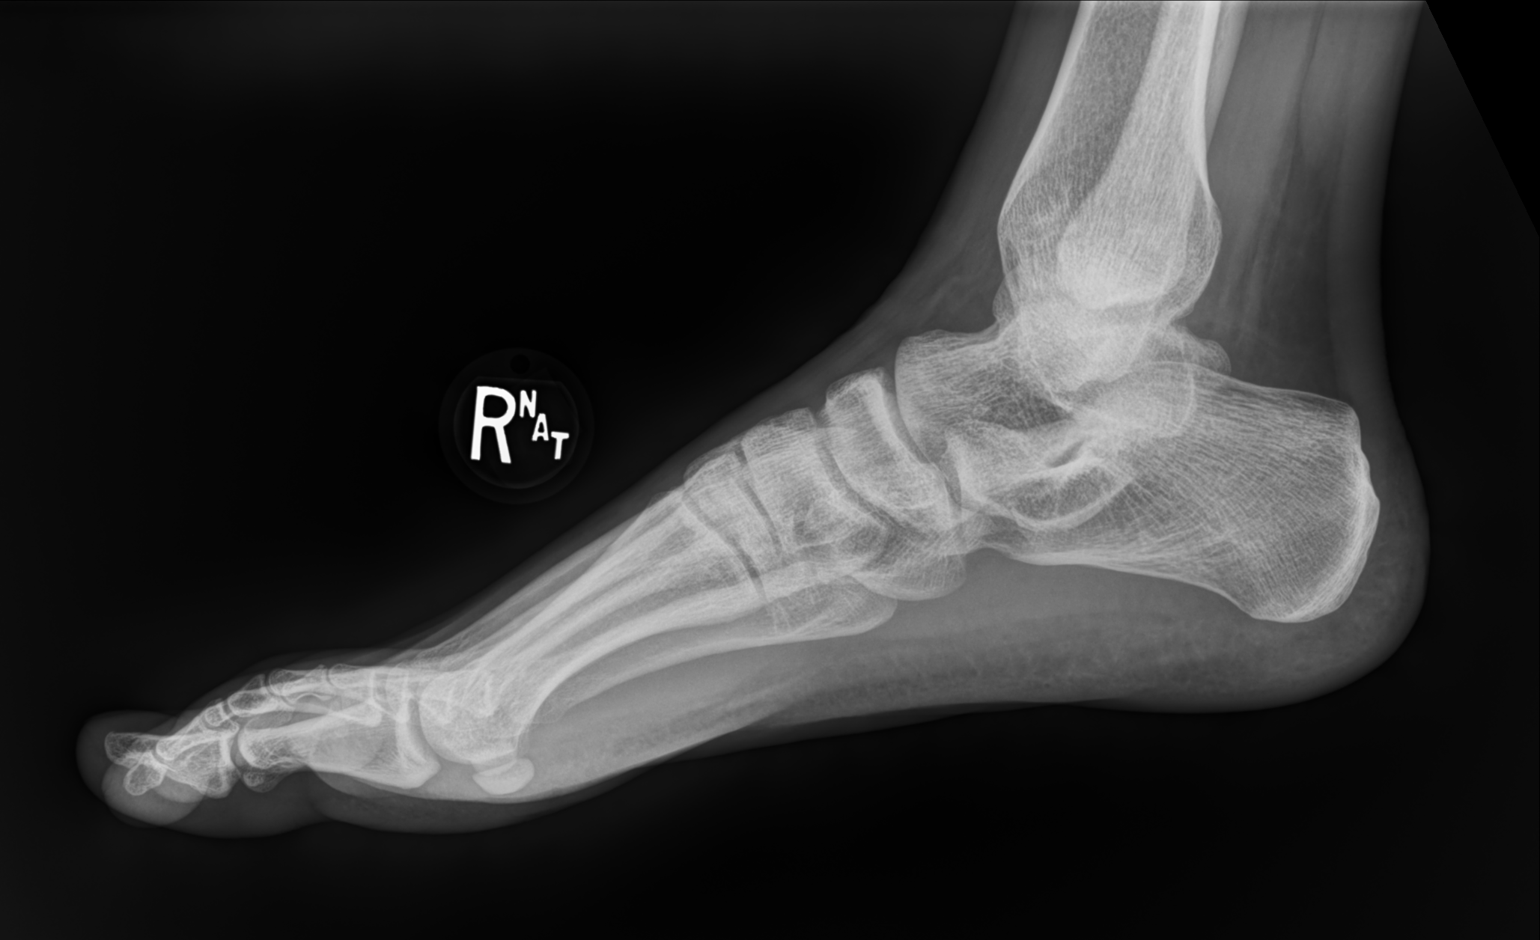

[3 of 3 positions shown; findings below may reference images not displayed]

FINDINGS: There is no evidence of fracture or dislocation. There is no
evidence of arthropathy or other focal bone abnormality. Soft
tissues are unremarkable.
IMPRESSION: Negative.

## 2022-10-28 ENCOUNTER — Encounter (HOSPITAL_COMMUNITY): Payer: Self-pay

## 2022-10-28 ENCOUNTER — Ambulatory Visit (HOSPITAL_COMMUNITY)
Admission: EM | Admit: 2022-10-28 | Discharge: 2022-10-28 | Disposition: A | Discharge status: Home / Self Care | Payer: BC Managed Care – PPO | Attending: Physician Assistant | Admitting: Physician Assistant

## 2022-10-28 DIAGNOSIS — S93601A Unspecified sprain of right foot, initial encounter: Secondary | ICD-10-CM

## 2022-10-28 MED ORDER — NAPROXEN 500 MG PO TABS: 500.0000 mg | ORAL_TABLET | Freq: Two times a day (BID) | ORAL | 0 refills | Status: AC

## 2022-10-28 NOTE — ED Triage Notes (Signed)
Patient here today with c/o right bottom of foot pain that started today after lifting and moving a package. He states that he felt a tear on the bottom of his foot. He states that he has a h/o plantar fasciitis. Increased pain with bending his foot.

## 2022-10-28 NOTE — ED Provider Notes (Signed)
[facility]    CSN: [id number] Arrival date & time: 10/28/22  1846      History   Chief Complaint Chief Complaint  Patient presents with   Foot Pain    HPI Dave Jones is a 32 y.o. male.   Patient presents today with a several hour history of pain in the bottom of his right foot.  He reports that he was at work (he is an International aid/development worker at [employer]) when he was lifting a heavy package and he twisted which caused a pain/tearing sensation in the bottom of his right foot.  He reports that pain is rated 6 on a 0-10 pain scale, localized to area near his heel with radiation to the bottom of his foot, described as tearing, worse with certain movements, no alleviating factors notified.  He did try ibuprofen 800 mg but this has not been effective in managing his symptoms.  He does report a remote history of plantar fasciitis but current symptoms are not similar to previous episodes of this condition.  He is no longer followed by podiatry.  Does report he has a strenuous job where he walks 60,000+ steps per day and this activity has exacerbated his pain.  He denies any recent trauma or additional injury.  His previous surgery.  Denies any numbness or paresthesias in the foot.    History reviewed. No pertinent past medical history.  There are no problems to display for this patient.   History reviewed. No pertinent surgical history.     Home Medications    Prior to Admission medications   Medication Sig Start Date End Date Taking? Authorizing Provider  naproxen (NAPROSYN) 500 MG tablet Take 1 tablet (500 mg total) by mouth 2 (two) times daily. 10/28/22  Yes Bryann Gentz, Noberto Retort, PA-C    Family History Family History  Family history unknown: Yes    Social History Social History   Tobacco Use   Smoking status: Former    Types: Cigarettes    Passive exposure: Never   Smokeless tobacco: Never  Vaping Use   Vaping status: Some Days  Substance Use Topics   Alcohol use:  Yes    Comment: ocassionally   Drug use: Not Currently    Types: Marijuana     Allergies   Patient has no known allergies.   Review of Systems Review of Systems  Constitutional:  Positive for activity change. Negative for appetite change, fatigue and fever.  Musculoskeletal:  Positive for gait problem and myalgias. Negative for arthralgias.  Skin:  Negative for color change and wound.  Neurological:  Negative for weakness and numbness.     Physical Exam Triage Vital Signs ED Triage Vitals  Encounter Vitals Group     BP 10/28/22 1923 121/78     Systolic BP Percentile --      Diastolic BP Percentile --      Pulse Rate 10/28/22 1923 76     Resp 10/28/22 1923 16     Temp 10/28/22 1923 98.6 F (37 C)     Temp Source 10/28/22 1923 Oral     SpO2 10/28/22 1923 98 %     Weight 10/28/22 1923 155 lb (70.3 kg)     Height 10/28/22 1923 6' (1.829 m)     Head Circumference --      Peak Flow --      Pain Score 10/28/22 1922 7     Pain Loc --      Pain Education --  Exclude from Growth Chart --    No data found.  Updated Vital Signs BP 121/78 (BP Location: Left Arm)   Pulse 76   Temp 98.6 F (37 C) (Oral)   Resp 16   Ht 6' (1.829 m)   Wt 155 lb (70.3 kg)   SpO2 98%   BMI 21.02 kg/m   Visual Acuity Right Eye Distance:   Left Eye Distance:   Bilateral Distance:    Right Eye Near:   Left Eye Near:    Bilateral Near:     Physical Exam Vitals reviewed.  Constitutional:      General: He is awake.     Appearance: Normal appearance. He is well-developed. He is not ill-appearing.     Comments: Very pleasant male appears stated age in no acute distress sitting comfortably in exam room  HENT:     Head: Normocephalic and atraumatic.  Cardiovascular:     Rate and Rhythm: Normal rate and regular rhythm.     Pulses:          Posterior tibial pulses are 2+ on the right side.     Heart sounds: Normal heart sounds, S1 normal and S2 normal. No murmur heard.    Comments:  Capillary fill within 2 seconds right toes Pulmonary:     Effort: Pulmonary effort is normal.     Breath sounds: Normal breath sounds. No stridor. No wheezing, rhonchi or rales.     Comments: Clear to auscultation bilaterally Abdominal:     General: Bowel sounds are normal.     Palpations: Abdomen is soft.     Tenderness: There is no abdominal tenderness.  Musculoskeletal:     Right foot: Normal range of motion and normal capillary refill. Tenderness present. No deformity, bunion or bony tenderness.       Feet:  Feet:     Right foot:     Protective Sensation: 10 sites tested.  10 sites sensed.     Skin integrity: No ulcer, blister or skin breakdown.     Toenail Condition: Right toenails are normal.     Comments: No deformity noted right foot.  Normal plantar and dorsiflexion.  Normal active range of motion of toes.  Tenderness palpation over plantar foot near calcaneus without deformity. Neurological:     Mental Status: He is alert.  Psychiatric:        Behavior: Behavior is cooperative.      UC Treatments / Results  Labs (all labs ordered are listed, but only abnormal results are displayed) Labs Reviewed - No data to display  EKG   Radiology No results found.  Procedures Procedures (including critical care time)  Medications Ordered in UC Medications - No data to display  Initial Impression / Assessment and Plan / UC Course  I have reviewed the triage vital signs and the nursing notes.  Pertinent labs & imaging results that were available during my care of the patient were reviewed by me and considered in my medical decision making (see chart for details).     Patient is well-appearing, afebrile, nontoxic, nontachycardic.  Plain films were deferred as he denies any recent trauma and has no focal bony tenderness.  Suspect sprain of foot.  He was prescribed Naprosyn for pain relief and we discussed that he is not to take NSAIDs with this medication due to risk of GI  bleeding.  He can use Tylenol for breakthrough pain.  He was placed in a postop shoe for comfort and support.  Encouraged him to keep his foot elevated and also use ice to help manage symptoms.  Also encouraged that he rolled his foot over a ice water bottle for additional symptom relief.  Given his physically demanding job I did recommend he follow-up with podiatry to ensure that there is not more serious injury and that this heals appropriately to prevent long-term issues.  He was given contact information for local provider with instruction to call to schedule an appointment.  We discussed that if he has any worsening or changing symptoms including increasing pain, numbness, tingling, difficulty walking he needs to be seen immediately.  Strict return precautions given.  Work excuse note provided.  Final Clinical Impressions(s) / UC Diagnoses   Final diagnoses:  Sprain of right foot, initial encounter     Discharge Instructions      Avoid strenuous activity including lots of walking.  Use the postop shoe for comfort and support.  Take Naprosyn twice a day.  Do not take NSAIDs with this medication including aspirin, ibuprofen/Advil, naproxen/Aleve.  You can use Tylenol/acetaminophen for additional pain relief.  Keep it elevated and use ice.  Roll your foot over frozen water bottle to help with your symptoms.  Follow-up with podiatry.  Call them to schedule an appointment.  If anything worsens to be seen immediately.     ED Prescriptions     Medication Sig Dispense Auth. Provider   naproxen (NAPROSYN) 500 MG tablet Take 1 tablet (500 mg total) by mouth 2 (two) times daily. 30 tablet Teagan Heidrick, Noberto Retort, PA-C      PDMP not reviewed this encounter.   Jeani Hawking, PA-C 10/28/22 1945

## 2022-10-28 NOTE — Discharge Instructions (Signed)
Avoid strenuous activity including lots of walking.  Use the postop shoe for comfort and support.  Take Naprosyn twice a day.  Do not take NSAIDs with this medication including aspirin, ibuprofen/Advil, naproxen/Aleve.  You can use Tylenol/acetaminophen for additional pain relief.  Keep it elevated and use ice.  Roll your foot over frozen water bottle to help with your symptoms.  Follow-up with podiatry.  Call them to schedule an appointment.  If anything worsens to be seen immediately.

## 2023-05-10 ENCOUNTER — Other Ambulatory Visit: Payer: Self-pay

## 2023-05-10 ENCOUNTER — Emergency Department (HOSPITAL_COMMUNITY)
Admission: EM | Admit: 2023-05-10 | Discharge: 2023-05-11 | Disposition: A | Discharge status: Home / Self Care | Payer: BC Managed Care – PPO | Attending: Emergency Medicine | Admitting: Emergency Medicine

## 2023-05-10 ENCOUNTER — Encounter (HOSPITAL_COMMUNITY): Payer: Self-pay | Admitting: *Deleted

## 2023-05-10 DIAGNOSIS — R197 Diarrhea, unspecified: Secondary | ICD-10-CM | POA: Diagnosis not present

## 2023-05-10 DIAGNOSIS — R109 Unspecified abdominal pain: Secondary | ICD-10-CM | POA: Diagnosis not present

## 2023-05-10 DIAGNOSIS — R112 Nausea with vomiting, unspecified: Secondary | ICD-10-CM | POA: Diagnosis present

## 2023-05-10 MED ORDER — SODIUM CHLORIDE 0.9 % IV SOLN: Freq: Once | INTRAVENOUS | Status: AC

## 2023-05-10 MED ORDER — ONDANSETRON HCL 4 MG/2ML IJ SOLN
4.0000 mg | Freq: Once | INTRAMUSCULAR | Status: AC | PRN
  Administered 2023-05-10: 4 mg via INTRAVENOUS
  Filled 2023-05-10: qty 2

## 2023-05-10 NOTE — ED Triage Notes (Signed)
Pt ate a bacon wrap sandwich after 1pm, started feeling sick shortly after; c/o NVD since 6pm

## 2023-05-11 ENCOUNTER — Telehealth: Payer: Self-pay | Admitting: *Deleted

## 2023-05-11 LAB — COMPREHENSIVE METABOLIC PANEL
ALT: 26 U/L (ref 0–44)
AST: 30 U/L (ref 15–41)
Albumin: 4.4 g/dL (ref 3.5–5.0)
Alkaline Phosphatase: 41 U/L (ref 38–126)
Anion gap: 13 (ref 5–15)
BUN: 11 mg/dL (ref 6–20)
CO2: 20 mmol/L — ABNORMAL LOW (ref 22–32)
Calcium: 9.9 mg/dL (ref 8.9–10.3)
Chloride: 105 mmol/L (ref 98–111)
Creatinine, Ser: 0.97 mg/dL (ref 0.61–1.24)
GFR, Estimated: >60 mL/min (ref >60)
Glucose, Bld: 108 mg/dL — ABNORMAL HIGH (ref 70–99)
Potassium: 3.7 mmol/L (ref 3.5–5.1)
Sodium: 138 mmol/L (ref 135–145)
Total Bilirubin: 0.8 mg/dL (ref 0.0–1.2)
Total Protein: 7.8 g/dL (ref 6.5–8.1)

## 2023-05-11 LAB — CBC
HCT: 42.1 % (ref 39.0–52.0)
Hemoglobin: 13.6 g/dL (ref 13.0–17.0)
MCH: 23.2 pg — ABNORMAL LOW (ref 26.0–34.0)
MCHC: 32.3 g/dL (ref 30.0–36.0)
MCV: 71.7 fL — ABNORMAL LOW (ref 80.0–100.0)
Platelets: 274 10*3/uL (ref 150–400)
RBC: 5.87 MIL/uL — ABNORMAL HIGH (ref 4.22–5.81)
RDW: 16.9 % — ABNORMAL HIGH (ref 11.5–15.5)
WBC: 16.2 10*3/uL — ABNORMAL HIGH (ref 4.0–10.5)
nRBC: 0 % (ref 0.0–0.2)

## 2023-05-11 LAB — LIPASE, BLOOD: Lipase: 26 U/L (ref 11–51)

## 2023-05-11 MED ORDER — ONDANSETRON 4 MG PO TBDP: 4.0000 mg | ORAL_TABLET | Freq: Three times a day (TID) | ORAL | 0 refills | Status: AC | PRN

## 2023-05-11 MED ORDER — DICYCLOMINE HCL 20 MG PO TABS: 20.0000 mg | ORAL_TABLET | Freq: Two times a day (BID) | ORAL | 0 refills | Status: AC

## 2023-05-11 NOTE — ED Provider Notes (Signed)
[facility] Provider Note   CSN: [id number] Arrival date & time: 05/10/23  2317     History  Chief Complaint  Patient presents with   Abdominal Pain    Dave Jones is a 33 y.o. male.  The history is provided by the patient and medical records.  Abdominal Pain Associated symptoms: diarrhea, nausea and vomiting    33 y.o. M here with N/V/D after eating bacon wrap sandwich from cookout around 1PM.  States sandwich did taste a little funny.  Symptoms began shortly after.  Has had some abdominal cramping.  No fever/chills.  No meds taken PTA.  Home Medications Prior to Admission medications   Medication Sig Start Date End Date Taking? Authorizing Provider  dicyclomine (BENTYL) 20 MG tablet Take 1 tablet (20 mg total) by mouth 2 (two) times daily. 05/11/23  Yes Garlon Hatchet, PA-C  ondansetron (ZOFRAN-ODT) 4 MG disintegrating tablet Take 1 tablet (4 mg total) by mouth every 8 (eight) hours as needed for nausea. 05/11/23  Yes Garlon Hatchet, PA-C  naproxen (NAPROSYN) 500 MG tablet Take 1 tablet (500 mg total) by mouth 2 (two) times daily. 10/28/22   Raspet, Noberto Retort, PA-C      Allergies    Patient has no known allergies.    Review of Systems   Review of Systems  Gastrointestinal:  Positive for abdominal pain, diarrhea, nausea and vomiting.  All other systems reviewed and are negative.   Physical Exam Updated Vital Signs BP (!) 142/63 (BP Location: Right Arm)   Pulse 84   Temp 98.1 F (36.7 C)   Resp 16   SpO2 100%   Physical Exam Vitals and nursing note reviewed.  Constitutional:      Appearance: He is well-developed.  HENT:     Head: Normocephalic and atraumatic.  Eyes:     Conjunctiva/sclera: Conjunctivae normal.     Pupils: Pupils are equal, round, and reactive to light.  Cardiovascular:     Rate and Rhythm: Normal rate and regular rhythm.     Heart sounds: Normal heart sounds.  Pulmonary:     Effort: Pulmonary  effort is normal.     Breath sounds: Normal breath sounds.  Abdominal:     General: Bowel sounds are normal.     Palpations: Abdomen is soft.     Tenderness: There is no abdominal tenderness. There is no rebound. Negative signs include McBurney's sign.     Comments: Soft, nontender  Musculoskeletal:        General: Normal range of motion.     Cervical back: Normal range of motion.  Skin:    General: Skin is warm and dry.  Neurological:     Mental Status: He is alert and oriented to person, place, and time.     ED Results / Procedures / Treatments   Labs (all labs ordered are listed, but only abnormal results are displayed) Labs Reviewed  COMPREHENSIVE METABOLIC PANEL - Abnormal; Notable for the following components:      Result Value   CO2 20 (*)    Glucose, Bld 108 (*)    All other components within normal limits  CBC - Abnormal; Notable for the following components:   WBC 16.2 (*)    RBC 5.87 (*)    MCV 71.7 (*)    MCH 23.2 (*)    RDW 16.9 (*)    All other components within normal limits  LIPASE, BLOOD    EKG  None  Radiology No results found.  Procedures Procedures    Medications Ordered in ED Medications  ondansetron (ZOFRAN) injection 4 mg (4 mg Intravenous Given 05/10/23 2346)  0.9 %  sodium chloride infusion (0 mLs Intravenous Stopped 05/11/23 0027)    ED Course/ Medical Decision Making/ A&P                                 Medical Decision Making Amount and/or Complexity of Data Reviewed Labs: ordered. ECG/medicine tests: ordered and independent interpretation performed.  Risk Prescription drug management.   33 year old male here with nausea, vomiting, and diarrhea after eating bacon wrap sandwich from cookout around 1 PM.  He is afebrile and nontoxic in appearance.  His abdomen is soft and nontender.  Labs were obtained and he does have leukocytosis which may be reactive from vomiting.  No significant electrolyte derangement.  Lipase is normal.   Treated here with IV fluids and Zofran and is feeling better.  He was able to tolerate oral fluids without difficulty.  That he is stable for discharge with continued symptomatic care.  Encouraged good oral hydration, bland diet for now and advance as tolerated.  Can follow-up with PCP.  Return here for new concerns.  Final Clinical Impression(s) / ED Diagnoses Final diagnoses:  Nausea vomiting and diarrhea    Rx / DC Orders ED Discharge Orders          Ordered    dicyclomine (BENTYL) 20 MG tablet  2 times daily        05/11/23 0052    ondansetron (ZOFRAN-ODT) 4 MG disintegrating tablet  Every 8 hours PRN        05/11/23 0052              Garlon Hatchet, PA-C 05/11/23 0058    Sabas Sous, MD 05/11/23 [phone]

## 2023-05-11 NOTE — ED Notes (Signed)
Pt reports feeling better after fluids. Given water for PO challenge

## 2023-05-11 NOTE — Telephone Encounter (Signed)
Pt called regarding which pharmacy Rx was e-scribed to.  RNCM reviewed chart to access After Visit Summary and found that Rx was sent to [facility] on [address].  Advised pt to pick up at his convenience.

## 2023-05-11 NOTE — Discharge Instructions (Signed)
Take the prescribed medication as directed.  Hydrate well at home, bland diet for now and progress back to normal as tolerated. Follow-up with your doctor. Return to the ED for new or worsening symptoms.
